# Patient Record
Sex: Female | Born: 1980 | Race: White | Hispanic: No | Marital: Single | Smoking: Current every day smoker
Health system: Southern US, Community
[De-identification: ages and names within clinical notes are randomized; demographics above are authoritative.]

---

## 2006-07-08 ENCOUNTER — Emergency Department (HOSPITAL_COMMUNITY): Admission: EM | Admit: 2006-07-08 | Discharge: 2006-07-08 | Payer: Self-pay | Admitting: Emergency Medicine

## 2008-05-09 ENCOUNTER — Ambulatory Visit: Payer: Self-pay | Admitting: Obstetrics and Gynecology

## 2008-05-09 ENCOUNTER — Inpatient Hospital Stay (HOSPITAL_COMMUNITY): Admission: AD | Admit: 2008-05-09 | Discharge: 2008-05-09 | Payer: Self-pay | Admitting: Obstetrics and Gynecology

## 2008-07-30 ENCOUNTER — Inpatient Hospital Stay (HOSPITAL_COMMUNITY): Admission: AD | Admit: 2008-07-30 | Discharge: 2008-07-30 | Payer: Self-pay | Admitting: Obstetrics and Gynecology

## 2008-07-31 ENCOUNTER — Inpatient Hospital Stay (HOSPITAL_COMMUNITY): Admission: AD | Admit: 2008-07-31 | Discharge: 2008-07-31 | Payer: Self-pay | Admitting: Obstetrics & Gynecology

## 2008-08-11 ENCOUNTER — Inpatient Hospital Stay (HOSPITAL_COMMUNITY): Admission: AD | Admit: 2008-08-11 | Discharge: 2008-08-12 | Payer: Self-pay | Admitting: Obstetrics and Gynecology

## 2008-08-17 ENCOUNTER — Inpatient Hospital Stay (HOSPITAL_COMMUNITY): Admission: AD | Admit: 2008-08-17 | Discharge: 2008-08-18 | Payer: Self-pay | Admitting: Obstetrics & Gynecology

## 2008-08-23 ENCOUNTER — Encounter (INDEPENDENT_AMBULATORY_CARE_PROVIDER_SITE_OTHER): Payer: Self-pay | Admitting: Obstetrics and Gynecology

## 2008-08-23 ENCOUNTER — Inpatient Hospital Stay (HOSPITAL_COMMUNITY): Admission: RE | Admit: 2008-08-23 | Discharge: 2008-08-25 | Payer: Self-pay | Admitting: Obstetrics and Gynecology

## 2008-09-19 LAB — CONVERTED CEMR LAB: Pap Smear: NORMAL

## 2009-03-04 IMAGING — US US OB COMP +14 WK
1 series · 14 of 28 positions shown · non-contrast
Comparison: none

OBSTETRICAL ULTRASOUND:
 This ultrasound exam was performed in the [HOSPITAL] Ultrasound Department.  The OB US report was generated in the AS system, and faxed to the ordering physician.  This report is also available in [REDACTED] PACS.

[Series 1: us ob comp +14 wk · 14 of 39 slices shown]
[im 2/39]
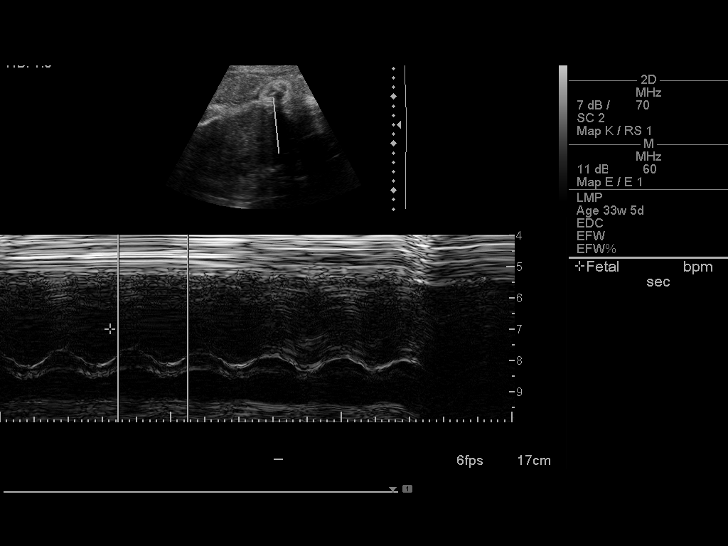
[im 5/39]
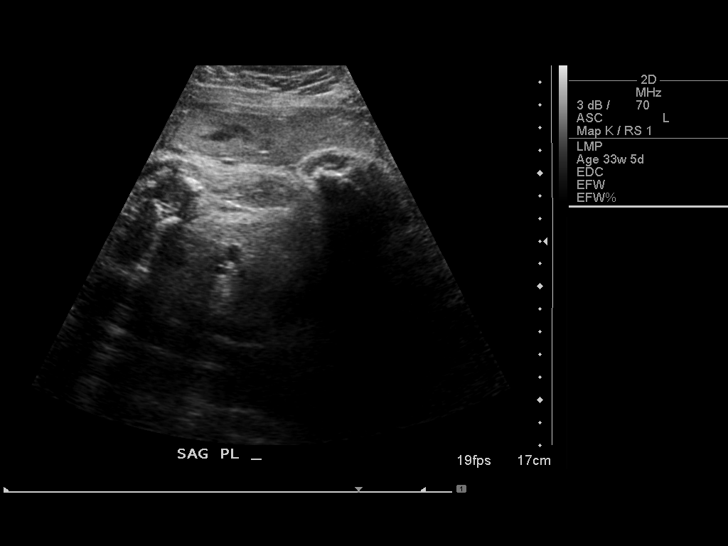
[im 8/39]
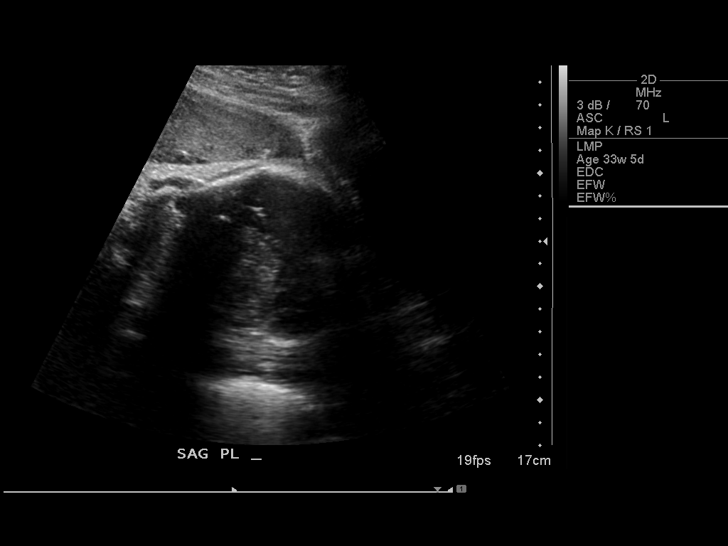
[im 10/39]
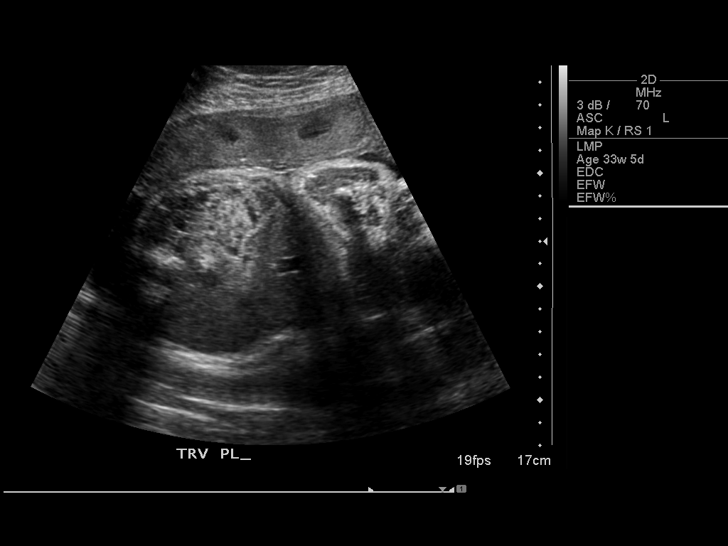
[im 13/39]
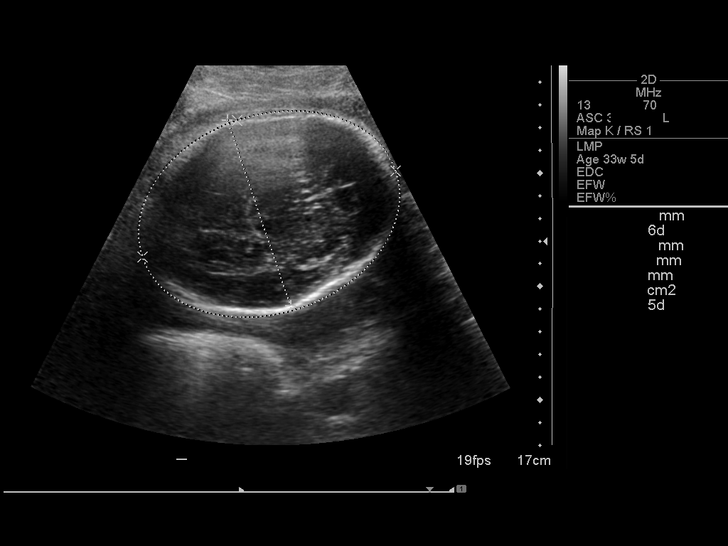
[im 16/39]
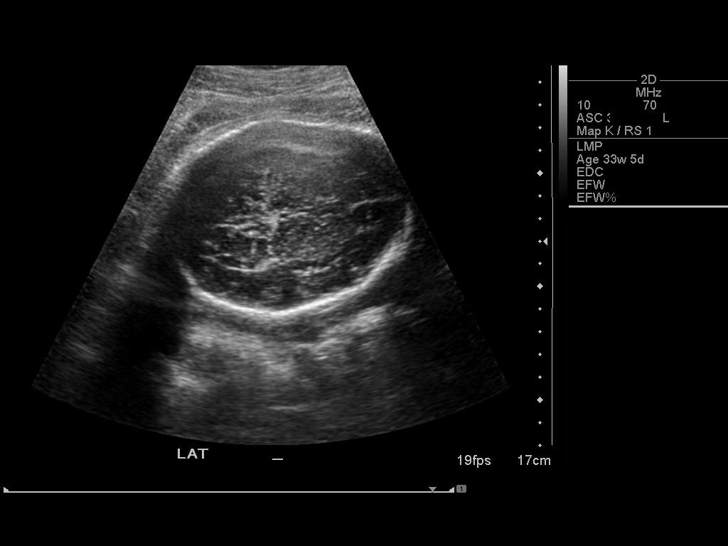
[im 19/39]
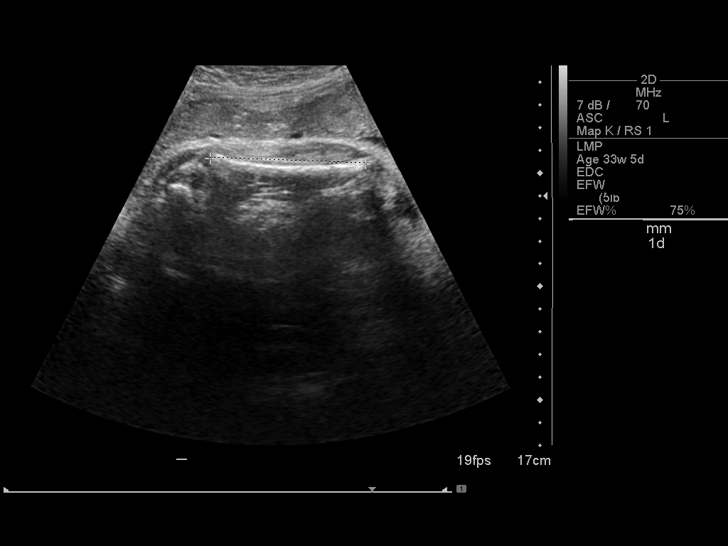
[im 22/39]
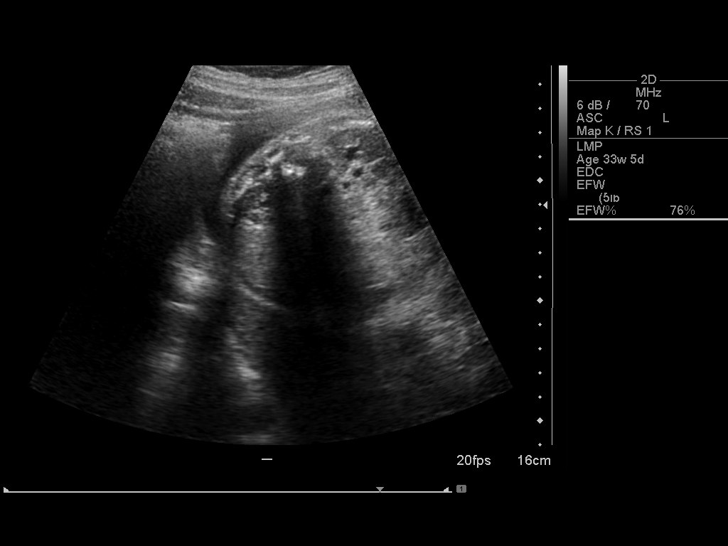
[im 24/39]
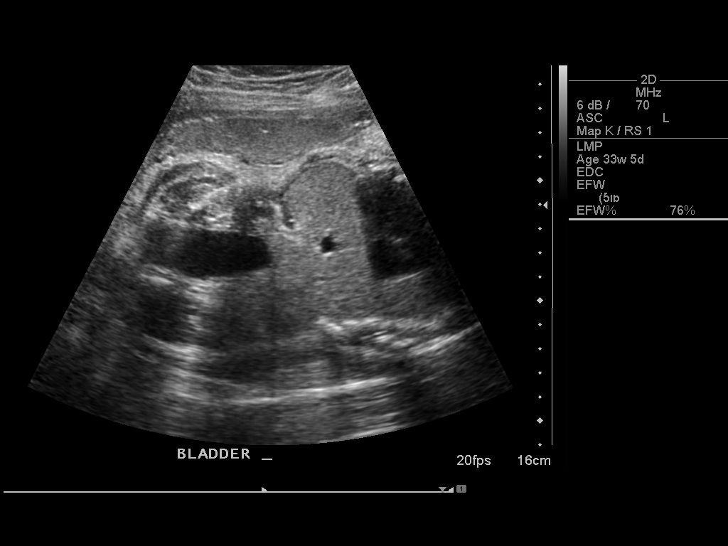
[im 27/39]
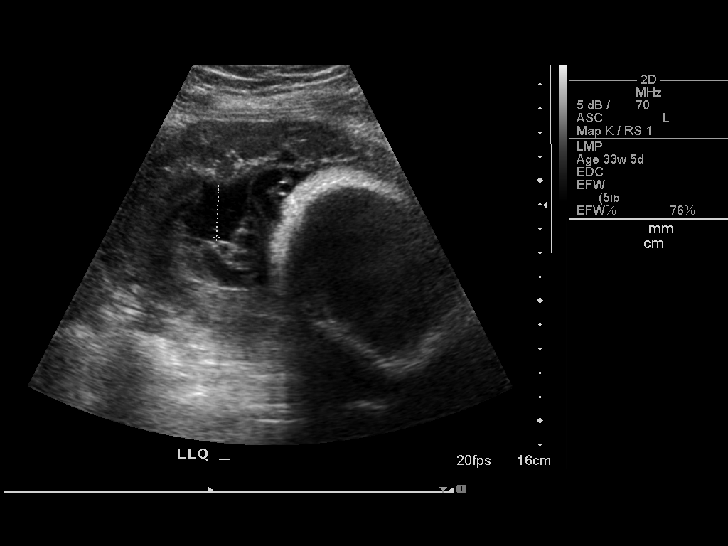
[im 30/39]
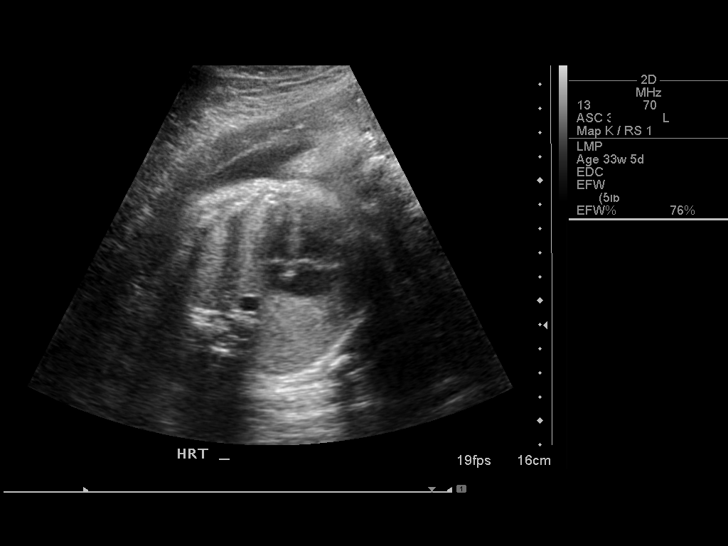
[im 33/39]
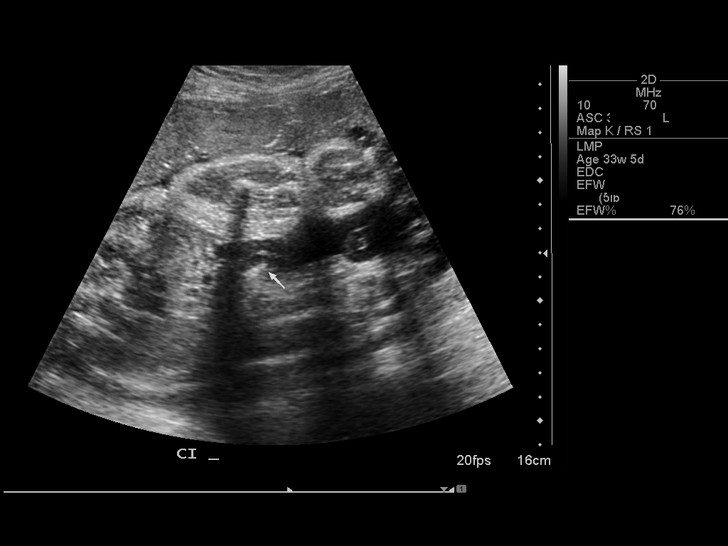
[im 36/39]
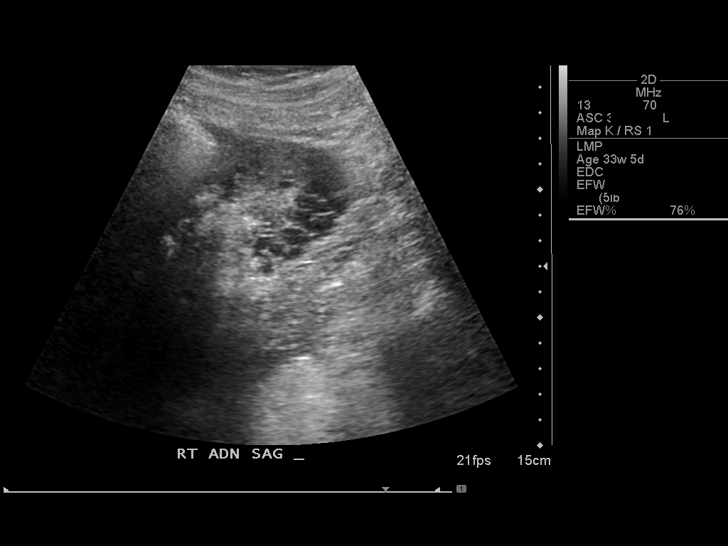
[im 39/39]
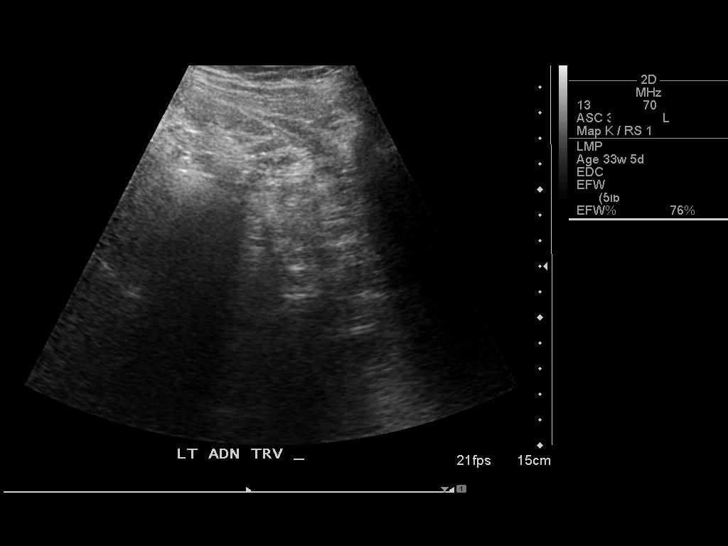

[14 of 28 positions shown; findings below may reference images not displayed]

IMPRESSION: See AS Obstetric US report.

## 2009-10-07 ENCOUNTER — Telehealth: Payer: Self-pay | Admitting: Family

## 2009-10-07 ENCOUNTER — Ambulatory Visit: Payer: Self-pay | Admitting: Family

## 2009-10-07 DIAGNOSIS — F411 Generalized anxiety disorder: Secondary | ICD-10-CM | POA: Insufficient documentation

## 2009-10-07 LAB — CONVERTED CEMR LAB
Free T4: 1.24 ng/dL (ref 0.80–1.80)
T3, Free: 3.1 pg/mL (ref 2.3–4.2)
TSH: 1.268 microintl units/mL (ref 0.350–4.500)

## 2010-02-07 ENCOUNTER — Telehealth (INDEPENDENT_AMBULATORY_CARE_PROVIDER_SITE_OTHER): Payer: Self-pay | Admitting: *Deleted

## 2010-09-26 NOTE — Progress Notes (Signed)
Summary: patient states she has no records anywhere else   Phone Note Call from Patient   Caller: patient live Call For: [place]  Summary of Call: patient states when she filled out her new patient paperwork that she has no records anywhere else.  Initial call taken by: Roselle Locus,  October 07, 2009 1:32 PM

## 2010-09-26 NOTE — Assessment & Plan Note (Signed)
Summary: NEW PT/BCBS/NS/KDC   Vital Signs:  Patient profile:   30 year old female Menstrual status:  regular LMP:     09/12/2009 Height:      64.50 inches Weight:      159 pounds BMI:     26.97 Temp:     98.4 degrees F oral Pulse rate:   72 / minute BP sitting:   124 / 70  (right arm)  Vitals Entered By: Doristine Devoid (October 07, 2009 1:47 PM) CC: NEW EST- anxiety and stress  LMP (date): 09/12/2009     Menstrual Status regular Enter LMP: 09/12/2009 Last PAP Result normal   CC:  NEW EST- anxiety and stress .  History of Present Illness: [name] is a 30 year old female who presents today to establish care.  Her chief complaint today is anxiety.  She is a single mother of two children ages 79 and 52 and is also employed at [employer]  which is currently undergoing changes- they are offering employees various options as their branch is closing.  Notes approximately 3 panic attacks a day which are accompanied by palpitations and shortness of breath.  She continues to feel nervous in between these panic attacks.  She denies associated depression.  She is sleeping poorly.  Wakes up frequently at night.  5 years ago she was treated for anxiety- notes that she was treated with zoloft and paxil at that time.  Felt drowsy on these meds.    Preventive Screening-Counseling & Management  Alcohol-Tobacco     Smoking Status: current  Allergies (verified): 1)  ! 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 Lungs are clear to auscultation, no crackles or wheezes. Heart:   Normal rate and regular rhythm. S1 and S2 normal without gallop, murmur, click, rub or other extra sounds. Psych:  normally interactive.   appears mildly anxious   Impression & Recommendations:  Problem # 1:  ANXIETY (ICD-300.00) Assessment Deteriorated Will try fluoxetine with as needed klonopin.  Plan reevaluation in 1 month.  Patient was counselled to d/c med and go to ER if she develops suicidal ideation. It sounds like patient is having a great deal of stress currently which is likely exacerbating her anxiety and panic attacks.  Will check TFT's to rule out hyper thyroid as contributing to patient's symptoms of anxiety.  Plan for full physical next visit.   Her updated medication list for this problem includes:    Fluoxetine Hcl 10 Mg Caps (Fluoxetine hcl) ..... One tablet by mouth daily x 1 week, then increase to 2 tabs by mouth daily    Klonopin 0.5 Mg Tabs (Clonazepam) ..... One tablet by mouth three times a day as needed  Orders: T-TSH ([id number]) T-T4, Free [phone]) T-T3, Free [phone])  Complete Medication List: 1)  Fluoxetine Hcl 10 Mg Caps (Fluoxetine hcl) .... One tablet by mouth daily x 1 week, then increase to 2 tabs by mouth daily 2)  Klonopin 0.5 Mg Tabs (Clonazepam) .... One tablet by mouth three times a day as needed  Patient Instructions: 1)  It will likely take several weeks before you will notice improvement on fluoxetine. 2)  Side effects of this  medicine may include drowsiness or nausea.  If this becomes an issue for you call for further instructions. 3)  Very rarely people may develop suicidal thoughts when taking these types of medicines- should this happen to you, discontinue medication and go directly to the emergency room. 4)  Please arrange a follow up appointment in 1 month or a complete physical and follow up. 5)  Please return fasting for the following labs: 6)  CBC, BMET, FLP (V70) 7)  It was a pleasure to meet  you! Prescriptions: KLONOPIN 0.5 MG TABS (CLONAZEPAM) one tablet by mouth three times a day as needed  #30 x 0   Entered and Authorized by:   Lemont Fillers FNP   Signed by:   Lemont Fillers FNP on 10/07/2009   Method used:   Print then Give to Patient   RxID:   [id number] FLUOXETINE HCL 10 MG CAPS (FLUOXETINE HCL) one tablet by mouth daily x 1 week, then increase to 2 tabs by mouth daily  #30 x 0   Entered and Authorized by:   Lemont Fillers FNP   Signed by:   Lemont Fillers FNP on 10/07/2009   Method used:   Electronically to        [facility] [address] (retail)       [phone]       [place], [place]  [address]       Ph: [phone]       Fax: [phone]   RxID:   [id number]   Preventive Care Screening  Last Tetanus Booster:    Date:  05/27/2009    Results:  Td   Pap Smear:    Date:  09/19/2008    Results:  normal

## 2010-09-26 NOTE — Progress Notes (Signed)
Summary: elevated bp  Phone Note Call from Patient   Caller: Patient Summary of Call: patient called left work bp elevated 138/90 says she is having some HA and vision issues unable to travel to see Efraim Kaufmann at [place] informed patient since this is new issue for her and w/ symptoms she will go to [facility] for evaluation ............Marland KitchenDoristine Devoid  February 07, 2010 12:40 PM

## 2010-09-26 NOTE — Letter (Signed)
   [facility] [address] [place], [place] [address] [phone]    October 07, 2009   [place] [address] [place], [place] [address]  RE:  LAB RESULTS  Dear  Ms. Conley Rolls,  The following is an interpretation of your most recent lab tests.  Please take note of any instructions provided or changes to medications that have resulted from your lab work.  THYROID STUDIES:  Thyroid studies normal TSH: 1.268      Sincerely Yours,    Lemont Fillers FNP

## 2010-11-13 ENCOUNTER — Other Ambulatory Visit (HOSPITAL_COMMUNITY)
Admission: RE | Admit: 2010-11-13 | Discharge: 2010-11-13 | Disposition: A | Discharge status: Home / Self Care | Payer: BC Managed Care – PPO | Source: Ambulatory Visit | Attending: Obstetrics & Gynecology | Admitting: Obstetrics & Gynecology

## 2010-11-13 ENCOUNTER — Encounter (INDEPENDENT_AMBULATORY_CARE_PROVIDER_SITE_OTHER): Payer: Self-pay | Admitting: Obstetrics & Gynecology

## 2010-11-13 ENCOUNTER — Other Ambulatory Visit: Payer: Self-pay | Admitting: Obstetrics & Gynecology

## 2010-11-13 DIAGNOSIS — R87612 Low grade squamous intraepithelial lesion on cytologic smear of cervix (LGSIL): Secondary | ICD-10-CM

## 2010-11-13 DIAGNOSIS — R87619 Unspecified abnormal cytological findings in specimens from cervix uteri: Secondary | ICD-10-CM | POA: Insufficient documentation

## 2010-11-13 DIAGNOSIS — R87611 Atypical squamous cells cannot exclude high grade squamous intraepithelial lesion on cytologic smear of cervix (ASC-H): Secondary | ICD-10-CM

## 2010-12-07 ENCOUNTER — Ambulatory Visit: Payer: BC Managed Care – PPO | Admitting: Obstetrics & Gynecology

## 2010-12-07 DIAGNOSIS — D069 Carcinoma in situ of cervix, unspecified: Secondary | ICD-10-CM

## 2010-12-20 ENCOUNTER — Other Ambulatory Visit: Payer: Self-pay | Admitting: Family Medicine

## 2010-12-20 ENCOUNTER — Encounter (INDEPENDENT_AMBULATORY_CARE_PROVIDER_SITE_OTHER): Payer: BC Managed Care – PPO | Admitting: Family Medicine

## 2010-12-20 ENCOUNTER — Other Ambulatory Visit (HOSPITAL_COMMUNITY)
Admission: RE | Admit: 2010-12-20 | Discharge: 2010-12-20 | Disposition: A | Discharge status: Home / Self Care | Payer: BC Managed Care – PPO | Source: Ambulatory Visit | Attending: Family Medicine | Admitting: Family Medicine

## 2010-12-20 DIAGNOSIS — R87613 High grade squamous intraepithelial lesion on cytologic smear of cervix (HGSIL): Secondary | ICD-10-CM

## 2010-12-20 DIAGNOSIS — N871 Moderate cervical dysplasia: Secondary | ICD-10-CM | POA: Insufficient documentation

## 2010-12-21 NOTE — Group Therapy Note (Signed)
NAMENGOC, DETJEN                  ACCOUNT NO.:  [id number]  MEDICAL RECORD NO.:  [id number]           PATIENT TYPE:  A  LOCATION:  [facility]                   FACILITY:  [facility]  PHYSICIAN:  Lucina Mellow, DO   DATE OF BIRTH:  1981-01-09  DATE OF SERVICE:  12/20/2010                                 CLINIC NOTE  The patient is a 30 year old female gravida 3, para 3 who was seen in the clinic after having an abnormal Pap smear, followed up in the colposcopy.  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  The patient was scheduled in for LEEP procedure, and the patient presents today for the procedure.  The patient denies any symptoms at this time. She states this was her first ever abnormal Pap test, and she has never had any procedures done to her cervix up until the colpo a few weeks ago.  Lab values today included negative urine pregnancy test.  The patient was consented and a time-out was performed prior to the procedure.  The patient was placed in the dorsal lithotomy position and a sterile speculum was placed.  A stain with acetic acid was used to identify the edges and margins of the lesion, and it was noted to go outside of the squamocolumnar junction around the 12 o'clock position and around about 6 o'clock to 3 o'clock as well with mosaic vessels seen.  The patient then received a paracervical block of 1% lidocaine approximately 18 mL of lidocaine.  The patient had good anesthetic results from that.  The medium sized V curette was then placed on the LEEP machine.  It was placed next at 50 and a circumferential excisional biopsy was done at that time with good control of bleeding and was passed off for pathology.  The rollerball was then used for curettage to assist with bleeding, minimal bleeding was noted.  The patient tolerated the procedure well.  The patient was given postprocedure instructions that she would experience muscle like cramping and  discomfort. Recommend not to douche, use tampons, or have sexual intercourse, not to use a hot tub bath, but she could shower, to report any bleeding heavier than her period.  Severe pain or temperature greater than 100.5 degrees and to report to the MAU and she was scheduled for a followup in 1-2 weeks to discuss her results and her symptomatology.  Because the patient was very concerned about any kind of odor after the procedure as she had been reading about the procedure prior to today.  We discussed medical options and she agreed to take Flagyl 500 twice a day for 7-10 days to help with that.  The patient also was given a prescription for Motrin 800 mg q.8 for pain, was recommended to take this around the clock for least the next 72 hours and Percocet 5/325 for any kind of breakthrough pain to assist with any discomfort.  The patient voiced understanding of all these teaching instructions and agrees with the plan.  All of her questions were answered.          ______________________________ Lucina Mellow, DO    SH/MEDQ  D:  12/20/2010  T:  12/21/2010  Job:  [id number]

## 2011-01-09 NOTE — H&P (Signed)
[name], WIX                  ACCOUNT NO.:  [id number]   MEDICAL RECORD NO.:  [id number]          PATIENT TYPE:  INP   LOCATION:  9158                          [facility]   PHYSICIAN:  Lenoard Aden, M.D.DATE OF BIRTH:  1981-06-29   DATE OF ADMISSION:  08/17/2008  DATE OF DISCHARGE:                              HISTORY & PHYSICAL   CHIEF COMPLAINT:  Worsening oligohydramnios, elevated blood pressure,  and headache.   HISTORY OF PRESENT ILLNESS:  She is a 30 year old female G3, P2 at 53  and 3/7 weeks with a history of admission for an AFI of 1 week ago,  which responded to IV fluid hydration.  Biophysical profile of 6/8 in  the office today and AFI of 3.  She has currently been placed on  labetalol, this pregnancy for elevated blood pressure and reports an  occasional mild headache.   ALLERGIES:  She has allergies to PENICILLIN.   PERSONAL HISTORY:  She has a personal history of previous pregnancy  smoking exposure.   MEDICATIONS:  Prenatal vitamins and labetalol.   FAMILY HISTORY:  She has a family history of heart disease and stroke.  She has a history of vaginal delivery 5 pounds 3 ounces at 37 weeks',  uncomplicated, and a history of C-section for fetal distress in 2002.   PRENATAL COURSE:  It has been complicated by elevated blood pressure,  history of intermittent headaches, history of oligohydramnios.  The  patient has previously discussed having a repeat C-section, but now  desires trial of labor if possible.   PHYSICAL EXAMINATION:  GENERAL:  She is a well-developed, well-nourished  female in no acute distress.  HEENT:  Normal.  LUNGS:  Clear.  HEART:  Regular rate and rhythm.  ABDOMEN:  Soft, gravid, and nontender.  Estimated fetal weight 5.5 to 6  pounds.  Cervix is 2-3 cm, 60% vertex -1.  EXTREMITIES:  There are no cords.  NEUROLOGIC:  DTRs of 2+ with one beat of clonus on the left.  SKIN:  Intact.   NST is reactive. Rare contractions were  noted.  PIH labs and 24-hour  urine from today are pending.  NST currently is reactive as noted.   IMPRESSION:  1. This is a 36 and 3/7-week intrauterine pregnancy.  2. Previous cesarean section with a history of previous successful      vaginal delivery.  3. Gestational hypertension versus chronic hypertension with normal      labs today and now new onset worsening oligohydramnios.   PLAN:  We will repeat BPP tonight.  Check labs and we will discuss  delivery, pending labs, and repeat BPP rate of delivery to be further  discussed.      Lenoard Aden, M.D.  Electronically Signed     RJT/MEDQ  D:  08/17/2008  T:  08/18/2008  Job:  [id number]

## 2011-01-09 NOTE — H&P (Signed)
[name], [name]                  ACCOUNT NO.:  [id number]   MEDICAL RECORD NO.:  [id number]          PATIENT TYPE:  MAT   LOCATION:  [facility]                          [facility]   PHYSICIAN:  Lenoard Aden, M.D.DATE OF BIRTH:  [birth year]   DATE OF ADMISSION:  07/30/2008  DATE OF DISCHARGE:  07/30/2008                              HISTORY & PHYSICAL   CHIEF COMPLAINT:  Decreased fetal movement, but nonreactive NST in the  office.   She is a 30 year old white female G3, P2, history of previous C-section,  who is scheduled for repeat C-section, who presents with decreased fetal  movement, which has been a chronic issue this pregnancy.  She also had  elevated blood pressure in the office.  She reports occasional floaters.  Denies headaches or epigastric pain.   She has allergies to PENICILLIN.   MEDICATIONS:  Prenatal vitamins.   She is a reformed smoker.  Denies domestic or physical violence.   FAMILY HISTORY:  Heart disease and stroke.   PREVIOUS OB HISTORY:  Remarkable for vaginal delivery and C-section as  previously noted.   PRENATAL COURSE:  Otherwise uncomplicated.   PHYSICAL EXAMINATION:  GENERAL:  She is a well-developed, well-nourished  female in no acute distress.  HEENT:  Normal.  LUNGS:  Clear.  HEART:  Regular rhythm.  ABDOMEN:  Soft, gravid, nontender.  No upper quadrant tenderness.  No  CVA tenderness.  EXTREMITIES:  There are no cords.  DTRs are 2+.  NEUROLOGIC:  Nonfocal.  SKIN:  Intact.  PELVIC:  Deferred.  VITAL SIGNS:  Blood pressure is ranging initially 149/91, repeat blood  pressure is 130s/70s.   PIH labs are within normal limits with normal platelet count, normal  liver function tests, normal uric acid.  Her initial NST is nonreactive  with subsequent monitoring with greater than 10 x 10 beat accelerations,  no decelerations are noted and variability is fair to good.  Her BPP  reveals an appropriate for gestational age fetus with a normal  AFI.  The  BPP is 6/8 with breathing noted, but nonsustained at this time.   IMPRESSION:  1. 34-week intrauterine pregnancy.  2. Elevated blood pressure.  No stigmata of preeclampsia with normal      labs and normal repeat blood pressures, 24-hour urine pending.  3. Decreased fetal movement with reassuring tracing and biophysical      profile, however, completed biophysical profile by strict  criteria      6/10.   PLAN:  Repeat BPP in 24 hours.  Preeclamptic precautions were given.  Discharge home.  We will follow up as noted.  24-hour urine to be  collected.      Lenoard Aden, M.D.  Electronically Signed     RJT/MEDQ  D:  07/30/2008  T:  07/31/2008  Job:  [id number]

## 2011-01-09 NOTE — H&P (Signed)
[name], MANZER NO.:  [id number]   MEDICAL RECORD NO.:  [id number]           PATIENT TYPE:   LOCATION:                                 FACILITY:   PHYSICIAN:  Lenoard Aden, M.D.     DATE OF BIRTH:   DATE OF ADMISSION:  DATE OF DISCHARGE:                              HISTORY & PHYSICAL   CHIEF COMPLAINT:  Oligohydramnios.   She is a 30 year old female G3, P2 at 35-4/7 weeks' gestation with  oligohydramnios with an AFI of 4.8 today.  She has a history of  pregnancy induced hypertension __________her urine currently __________  occasional headache.  She is appropriate for gestational age fetus noted  by ultrasound and normal AFI 2 days ago, now with oligo today, 8/8 in  the office today.   Her medications include labetalol and prenatal vitamins.   She has LATEX allergy.  She is allergic to PENICILLIN __________   She has a history of 2 previous pregnancies, one vaginal delivery in  1999, and a C-section for fetal distress in 2002.   PHYSICAL EXAMINATION:  GENERAL:  She is a well-developed, well-nourished  female in no acute distress.  VITAL SIGNS:  Blood pressure 138/94.  HEENT:  Normal.  LUNGS: Clear.  ABDOMEN:  Soft, gravid, and nontender.  Cervix is closed, 60% vertex, -  1.__________  NEUROLOGIC:  Nonfocal.   IMPRESSION:  1. A 35 weeks OB.  2. __________.  3. History of gestational hypertension versus chronic hypertension,      __________ IV fluids, monitoring blood pressure closely.  Check      labs, PT, PTT, and AFI 24 hours prior to delivery.  __________      Lenoard Aden, M.D.  Electronically Signed     RJT/MEDQ  D:  08/11/2008  T:  08/12/2008  Job:  [id number]

## 2011-01-09 NOTE — H&P (Signed)
[name], [name]                  ACCOUNT NO.:  [id number]   MEDICAL RECORD NO.:  [id number]          PATIENT TYPE:  INP   LOCATION:  9101                          [facility]   PHYSICIAN:  Lenoard Aden, M.D.DATE OF BIRTH:  [birth year]   DATE OF ADMISSION:  08/23/2008  DATE OF DISCHARGE:                              HISTORY & PHYSICAL   CHIEF COMPLAINT:  Chronic hypertension with refractory oligo for  induction.   She is a 30 year old female G3, P2-0-0-2 at 50 weeks' gestation with  persistent refractory oligo with an AFI of less than 5 most recently,  3.8 and 4.5 for attempts at vaginal delivery.  She has a history of  previous successful vaginal delivery followed by C-section for fetal  distress.  Risks and benefits of C-section versus evac previously  discussed.  Previous low segment transverse cesarean section documented  she has allergies to penicillin.   She has medications include labetalol and prenatal vitamins.   She has a family history of stroke and heart disease.  Prenatal course  was complicated by refractory oligohydramnios, hypertension emerging in  the late mid to late third trimester for which she was placed on  labetalol.  She has been hospitalized on 2 occasions for persistent  oligo.  Discussion of this case with phone consult with Maternal Fetal  Medicine, decision was made to proceed with delivery at 37 weeks.   PHYSICAL EXAMINATION:  GENERAL:  She is a well-developed, well-nourished  white female in no acute distress.  VITAL SIGNS:  Blood pressure ranging 140/90s-100.  HEENT:  Normal.  Neck:  Supple.  Full range of motion.  Lungs:  Clear.  Heart:  Regular rhythm.  ABDOMEN:  Soft, gravid, nontender.  Estimated fetal weight 6.5 pounds.  Cervix is 3 cm, 70% vertex, -1.  EXTREMITIES:  No cords.  DTRs 2+.  No evidence of clonus.  NEUROLOGIC:  Nonfocal.  SKIN:  Intact.   IMPRESSION:  1. A 37-week obstetrics.  2. Previous cesarean section with  history of successful vaginal      delivery.  3. Questionable chronic hypertension versus gestational hypertension,      stable on labetalol.  Normal 24-hour urine, normal labs.  4. Refractory oligohydramnios.   PLAN:  Proceed with attempts at vaginal delivery.  Pitocin was started.  IUPC to be placed.  Epidural as needed.  Cautious attempts at vaginal  delivery discussed.      Lenoard Aden, M.D.  Electronically Signed     RJT/MEDQ  D:  08/23/2008  T:  08/23/2008  Job:  [id number]

## 2011-01-11 ENCOUNTER — Ambulatory Visit: Payer: BC Managed Care – PPO | Admitting: Family Medicine

## 2011-01-11 DIAGNOSIS — R87613 High grade squamous intraepithelial lesion on cytologic smear of cervix (HGSIL): Secondary | ICD-10-CM

## 2011-01-12 NOTE — Group Therapy Note (Signed)
[name], [name]                  ACCOUNT NO.:  [id number]  MEDICAL RECORD NO.:  [id number]           PATIENT TYPE:  A  [facility]ATION:  [facility]                   [facility]  PHYSICIAN:  Lucina Mellow, DO   DATE OF BIRTH:  [birth year]  DATE OF SERVICE:                                 CLINIC NOTE  The patient is a 30 year old gravida 3, para 3 who had a Pap test having HGSIL and then a followup of the colposcopy that also showed HGSIL with negative ECC.  The patient presented for her LEEP procedure which was performed by myself on December 20, 2010.  The patient states that she did complete a 7-day course of Flagyl and she noticed a small amount of discharge yesterday, no discharge currently, but she does on occasion have some discharge.  The patient states that she has already had intercourse.  She had intercourse yesterday of the day before and has small enough spotting afterwords.  The intercourse was not painful and she states that she otherwise feels fine.  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  The patient is recommended to have q.6 months Pap test, for which she understands and agrees.  All of her questions are answered.  It additional prescription for Flagyl 500 mg twice a day is provided to her and she is to call the clinic if she has any further questions or concerns.  Otherwise, she will follow up routinely in 6 months for a repeat Pap.          ______________________________ Lucina Mellow, DO    SH/MEDQ  D:  01/11/2011  T:  01/12/2011  Job:  [id number]

## 2011-05-30 LAB — URINE MICROSCOPIC-ADD ON

## 2011-05-30 LAB — URINALYSIS, ROUTINE W REFLEX MICROSCOPIC
Bilirubin Urine: NEGATIVE
Glucose, UA: NEGATIVE
Ketones, ur: >80 — AB
Nitrite: NEGATIVE
Urobilinogen, UA: 0.2
pH: 6.5

## 2011-06-01 LAB — CBC
HCT: 36.2 % (ref 36.0–46.0)
HCT: 37.2 % (ref 36.0–46.0)
Hemoglobin: 12.6 g/dL (ref 12.0–15.0)
Hemoglobin: 13.7 g/dL (ref 12.0–15.0)
MCHC: 33.6 g/dL (ref 30.0–36.0)
MCHC: 33.7 g/dL (ref 30.0–36.0)
MCHC: 34.3 g/dL (ref 30.0–36.0)
MCV: 91.6 fL (ref 78.0–100.0)
MCV: 92.4 fL (ref 78.0–100.0)
MCV: 92.6 fL (ref 78.0–100.0)
Platelets: 229 10*3/uL (ref 150–400)
Platelets: 252 10*3/uL (ref 150–400)
Platelets: 272 10*3/uL (ref 150–400)
RBC: 4.03 MIL/uL (ref 3.87–5.11)
RBC: 4.38 MIL/uL (ref 3.87–5.11)
RDW: 13.5 % (ref 11.5–15.5)
RDW: 13.5 % (ref 11.5–15.5)
RDW: 13.7 % (ref 11.5–15.5)
WBC: 13.3 10*3/uL — ABNORMAL HIGH (ref 4.0–10.5)

## 2011-06-01 LAB — COMPREHENSIVE METABOLIC PANEL
ALT: 21 U/L (ref 0–35)
ALT: 22 U/L (ref 0–35)
AST: 27 U/L (ref 0–37)
AST: 29 U/L (ref 0–37)
Albumin: 2.9 g/dL — ABNORMAL LOW (ref 3.5–5.2)
Albumin: 3 g/dL — ABNORMAL LOW (ref 3.5–5.2)
BUN: 5 mg/dL — ABNORMAL LOW (ref 6–23)
CO2: 20 mEq/L (ref 19–32)
Calcium: 9 mg/dL (ref 8.4–10.5)
Calcium: 9.5 mg/dL (ref 8.4–10.5)
Chloride: 103 mEq/L (ref 96–112)
Chloride: 108 mEq/L (ref 96–112)
Chloride: 96 mEq/L (ref 96–112)
Creatinine, Ser: 0.41 mg/dL (ref 0.4–1.2)
Creatinine, Ser: 0.57 mg/dL (ref 0.4–1.2)
Creatinine, Ser: 0.58 mg/dL (ref 0.4–1.2)
GFR calc Af Amer: >60 mL/min (ref >60)
GFR calc Af Amer: >60 mL/min (ref >60)
GFR calc Af Amer: >60 mL/min (ref >60)
GFR calc non Af Amer: >60 mL/min (ref >60)
Glucose, Bld: 81 mg/dL (ref 70–99)
Sodium: 124 mEq/L — ABNORMAL LOW (ref 135–145)
Sodium: 135 mEq/L (ref 135–145)
Total Bilirubin: 0.3 mg/dL (ref 0.3–1.2)
Total Bilirubin: 0.3 mg/dL (ref 0.3–1.2)
Total Bilirubin: 0.4 mg/dL (ref 0.3–1.2)
Total Protein: 6.2 g/dL (ref 6.0–8.3)
Total Protein: 6.3 g/dL (ref 6.0–8.3)

## 2011-06-01 LAB — LACTATE DEHYDROGENASE: LDH: 131 U/L (ref 94–250)

## 2011-06-01 LAB — URIC ACID: Uric Acid, Serum: 4.9 mg/dL (ref 2.4–7.0)

## 2011-07-05 ENCOUNTER — Encounter: Payer: BC Managed Care – PPO | Admitting: Family Medicine

## 2011-07-06 ENCOUNTER — Encounter: Payer: Self-pay | Admitting: *Deleted

## 2011-07-06 DIAGNOSIS — D069 Carcinoma in situ of cervix, unspecified: Secondary | ICD-10-CM

## 2011-07-06 DIAGNOSIS — R87612 Low grade squamous intraepithelial lesion on cytologic smear of cervix (LGSIL): Secondary | ICD-10-CM

## 2011-07-06 DIAGNOSIS — IMO0002 Reserved for concepts with insufficient information to code with codable children: Secondary | ICD-10-CM | POA: Insufficient documentation

## 2011-08-23 ENCOUNTER — Ambulatory Visit (INDEPENDENT_AMBULATORY_CARE_PROVIDER_SITE_OTHER): Payer: BC Managed Care – PPO | Admitting: Family Medicine

## 2011-08-23 ENCOUNTER — Encounter: Payer: Self-pay | Admitting: Family Medicine

## 2011-08-23 VITALS — BP 137/92 | HR 87 | Temp 98.5°F | Ht 66.0 in | Wt 155.5 lb

## 2011-08-23 DIAGNOSIS — D069 Carcinoma in situ of cervix, unspecified: Secondary | ICD-10-CM

## 2011-08-23 NOTE — Progress Notes (Signed)
  Subjective:    Patient ID: [name], female    DOB: 02/26/1981, 30 y.o.   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  [facility], which was abnormal per the patient, which was not sent with the referral.  I discussed with the patient options of having another PAP done versus rescheduling the appointment while getting results.  Patient opted to have PAP done today.   Review of Systems     Objective:   Physical Exam  Constitutional: She appears well-developed and well-nourished.  Genitourinary: Vagina normal.       Normal external genitalia.  Normal vaginal tissue.  No gross abnormality to cervix.  No CMT or adnexal masses palpated.      Assessment & Plan:  1.  Abnormal PAP smear. PAP done.  Will call patient with any abnormal results.

## 2011-09-06 ENCOUNTER — Telehealth: Payer: Self-pay | Admitting: *Deleted

## 2011-09-06 NOTE — Telephone Encounter (Signed)
Called pt and pt wanted to know her pap results from 08/23/11 visit.  I informed her the results.  Pt stated understanding and had no further questions.

## 2011-09-06 NOTE — Telephone Encounter (Signed)
Pt called wanting test results and would like call back.

## 2011-09-26 ENCOUNTER — Telehealth: Payer: Self-pay | Admitting: *Deleted

## 2011-09-26 DIAGNOSIS — B9689 Other specified bacterial agents as the cause of diseases classified elsewhere: Secondary | ICD-10-CM

## 2011-09-26 NOTE — Telephone Encounter (Signed)
Pt called stating that she is having BV symptoms. Thin discharge with odor. She has had it before. She would like to have meds called in to her pharmacy. She cannot afford to come into clinic now.

## 2011-09-27 MED ORDER — TINIDAZOLE 500 MG PO TABS: 2.0000 g | ORAL_TABLET | Freq: Every day | ORAL | Status: AC

## 2011-09-27 NOTE — Telephone Encounter (Signed)
Spoke with patient advised her that we can call in medicine for her. If symptoms return in next six months she should call and make appointment

## 2012-01-29 ENCOUNTER — Telehealth: Payer: Self-pay | Admitting: *Deleted

## 2012-01-29 NOTE — Telephone Encounter (Signed)
Pt called and requested a nurse call her back. Patient states that she has symptoms of BV and would like a medicine called in. I advised her that the last time we did this in January the doctor said she would need to come in for a visit if it returned within 6 month. Appt made for patient for tomorrow.

## 2012-01-30 ENCOUNTER — Encounter: Payer: Self-pay | Admitting: Family Medicine

## 2012-01-30 ENCOUNTER — Ambulatory Visit (INDEPENDENT_AMBULATORY_CARE_PROVIDER_SITE_OTHER): Payer: BC Managed Care – PPO | Admitting: Family Medicine

## 2012-01-30 VITALS — BP 142/91 | HR 82 | Ht 64.0 in | Wt 167.1 lb

## 2012-01-30 DIAGNOSIS — N898 Other specified noninflammatory disorders of vagina: Secondary | ICD-10-CM

## 2012-01-30 DIAGNOSIS — L709 Acne, unspecified: Secondary | ICD-10-CM

## 2012-01-30 DIAGNOSIS — R87612 Low grade squamous intraepithelial lesion on cytologic smear of cervix (LGSIL): Secondary | ICD-10-CM

## 2012-01-30 DIAGNOSIS — R300 Dysuria: Secondary | ICD-10-CM

## 2012-01-30 DIAGNOSIS — L708 Other acne: Secondary | ICD-10-CM

## 2012-01-30 DIAGNOSIS — IMO0002 Reserved for concepts with insufficient information to code with codable children: Secondary | ICD-10-CM

## 2012-01-30 LAB — POCT URINALYSIS DIP (DEVICE)
Protein, ur: NEGATIVE mg/dL
Specific Gravity, Urine: 1.02 (ref 1.005–1.030)
Urobilinogen, UA: 0.2 mg/dL (ref 0.0–1.0)

## 2012-01-30 NOTE — Progress Notes (Signed)
  Subjective:    Patient ID: [name], female    DOB: 01-08-1981, 31 y.o.   MRN: [id number]  HPI  Vaginal discharge.  Says it started 2 days ago.  She complains of vaginal dryness, then noticed grey discharge.  Denies any foul odor.  She is currently on her period.  Denies any abdominal/pelvic or back pain.    Dysuria: She also complains of burning with urination that started 2 days ago.  She did have increased urinary frequency, but this is resolving.  No hematuria.  No fevers, chills, nausea or vomiting.    Acne: patient concerned about acne.  She has not tried any prescription medications yet.  Would like to establish care with a PCP.   Review of Systems  Per HPI    Objective:   Physical Exam  Constitutional: No distress.  Abdominal: Soft. Bowel sounds are normal. She exhibits no distension. There is no tenderness. There is no rebound and no guarding.  Genitourinary: Vagina normal. There is no rash, tenderness or lesion on the right labia. There is no rash, tenderness or lesion on the left labia. Cervix exhibits no motion tenderness, no discharge and no friability. Right adnexum displays no mass, no tenderness and no fullness. Left adnexum displays no mass, no tenderness and no fullness. No erythema around the vagina. No vaginal discharge found.          Assessment & Plan:

## 2012-01-30 NOTE — Assessment & Plan Note (Signed)
Because patient has had LEEP, will also do a pap smear today.  Last pap was December 2012.

## 2012-01-30 NOTE — Assessment & Plan Note (Addendum)
Will order UA today. Advised patient to call MD if symptoms worsen - develops fever, chills, N/V, abdominal/pelvic pain.

## 2012-01-30 NOTE — Assessment & Plan Note (Signed)
No copious amount of discharge seen on exam, however foul odor present. Wet prep, GC/chlamydia performed today. Will call patient if results are abnormal.

## 2012-01-30 NOTE — Progress Notes (Signed)
Patient seen and examined.  Agree with resident note.

## 2012-01-31 LAB — WET PREP, GENITAL: Yeast Wet Prep HPF POC: NONE SEEN

## 2012-02-20 ENCOUNTER — Ambulatory Visit: Payer: BC Managed Care – PPO | Admitting: Advanced Practice Midwife

## 2012-12-17 ENCOUNTER — Encounter (HOSPITAL_BASED_OUTPATIENT_CLINIC_OR_DEPARTMENT_OTHER): Payer: Self-pay | Admitting: *Deleted

## 2012-12-17 ENCOUNTER — Emergency Department (HOSPITAL_BASED_OUTPATIENT_CLINIC_OR_DEPARTMENT_OTHER)
Admission: EM | Admit: 2012-12-17 | Discharge: 2012-12-17 | Disposition: A | Discharge status: Home / Self Care | Payer: BC Managed Care – PPO | Attending: Emergency Medicine | Admitting: Emergency Medicine

## 2012-12-17 DIAGNOSIS — F17200 Nicotine dependence, unspecified, uncomplicated: Secondary | ICD-10-CM | POA: Insufficient documentation

## 2012-12-17 DIAGNOSIS — H6120 Impacted cerumen, unspecified ear: Secondary | ICD-10-CM | POA: Insufficient documentation

## 2012-12-17 DIAGNOSIS — H60399 Other infective otitis externa, unspecified ear: Secondary | ICD-10-CM | POA: Insufficient documentation

## 2012-12-17 DIAGNOSIS — H6093 Unspecified otitis externa, bilateral: Secondary | ICD-10-CM

## 2012-12-17 DIAGNOSIS — Z880 Allergy status to penicillin: Secondary | ICD-10-CM | POA: Insufficient documentation

## 2012-12-17 DIAGNOSIS — H6123 Impacted cerumen, bilateral: Secondary | ICD-10-CM

## 2012-12-17 DIAGNOSIS — J3489 Other specified disorders of nose and nasal sinuses: Secondary | ICD-10-CM | POA: Insufficient documentation

## 2012-12-17 MED ORDER — DOCUSATE SODIUM 100 MG PO CAPS
100.0000 mg | ORAL_CAPSULE | Freq: Once | ORAL | Status: AC
  Administered 2012-12-17: 100 mg via ORAL

## 2012-12-17 MED ORDER — DOCUSATE SODIUM 50 MG/5ML PO LIQD
ORAL | Status: AC
  Filled 2012-12-17: qty 10

## 2012-12-17 MED ORDER — NEOMYCIN-POLYMYXIN-HC 3.5-10000-1 OT SUSP: 4.0000 [drp] | Freq: Four times a day (QID) | OTIC | Status: AC

## 2012-12-17 MED ORDER — CIPROFLOXACIN-DEXAMETHASONE 0.3-0.1 % OT SUSP: OTIC | Status: DC

## 2012-12-17 NOTE — ED Notes (Signed)
Pt c/o left ear pain x 2 days  

## 2012-12-17 NOTE — ED Provider Notes (Signed)
History     CSN: [id number]  Arrival date & time 12/17/12  1242   First MD Initiated Contact with Patient 12/17/12 1417      Chief Complaint  Patient presents with  . Otalgia    (Consider location/radiation/quality/duration/timing/severity/associated sxs/prior treatment) HPI Patient presents to the emergency department chief complaint of left otalgia.  Patient states it has been worsening over the past 2 days.  Patient states that she also has associated sinus pressure, congestion, nasal discharge, itchy watery eyes and a history of seasonal allergies.  Patient denies any discharge from the ear, she denies any severe headaches, she denies any history of otitis media. Denies fevers, chills, myalgias, arthralgias. Denies DOE, SOB, chest tightness or pressure, radiation to left arm, jaw or back, or diaphoresis. Denies dysuria, flank pain, suprapubic pain, frequency, urgency, or hematuria. Denies headaches, light headedness, weakness, visual disturbances. Denies abdominal pain, nausea, vomiting, diarrhea or constipation.   History reviewed. No pertinent past medical history.  History reviewed. No pertinent past surgical history.  History reviewed. No pertinent family history.  History  Substance Use Topics  . Smoking status: Current Every Day Smoker -- 0.50 packs/day for 10 years    Types: Cigarettes  . Smokeless tobacco: Never Used  . Alcohol Use: Yes     Comment: social drinking    OB History   Grav Para Term Preterm Abortions TAB SAB Ect Mult Living   3 3 3       3       Review of Systems Ten systems reviewed and are negative for acute change, except as noted in the HPI.   Allergies  Penicillins  Home Medications   Current Outpatient Rx  Name  Route  Sig  Dispense  Refill  . phentermine 37.5 MG capsule   Oral   Take 37.5 mg by mouth every morning.             BP 134/93  Pulse 89  Temp(Src) 99.4 F (37.4 C) (Oral)  Resp 16  Ht 5\' 4"  (1.626 m)  Wt 170 lb  (77.111 kg)  BMI 29.17 kg/m2  SpO2 99%  LMP 12/10/2012  Physical Exam Physical Exam  Nursing note and vitals reviewed. Constitutional: She is oriented to person, place, and time. She appears well-developed and well-nourished. No distress.  HENT:  Head: Normocephalic and atraumatic.  Eyes: Conjunctivae normal and EOM are normal. Pupils are equal, round, and reactive to light. No scleral icterus.  Mouth: No exudates, erythema or swelling of the pharynx.  Uvula is midline airway is patent. Neck: Normal range of motion.  lateral tender cervical lymphadenopathy/ Ears: Bilateral cerumen impaction.  No tragal or mastoid tenderness.  No discharge or signs of otitis externa Cardiovascular: Normal rate, regular rhythm and normal heart sounds.  Exam reveals no gallop and no friction rub.   No murmur heard. Pulmonary/Chest: Effort normal and breath sounds normal. No respiratory distress.  Abdominal: Soft. Bowel sounds are normal. She exhibits no distension and no mass. There is no tenderness. There is no guarding.  Neurological: She is alert and oriented to person, place, and time.  Skin: Skin is warm and dry. She is not diaphoretic.    ED Course  Procedures (including critical care time)  Labs Reviewed - No data to display No results found.   1. Cerumen impaction, bilateral   2. Otitis externa of both ears       MDM   Filed Vitals:   12/17/12 1257  BP: 134/93  Pulse:  89  Temp: 99.4 F (37.4 C)  Resp: 16    Patient was cerumen impaction bilaterally.  She is currently having cerumen disimpaction.  We'll reevaluate shortly  Patient's this disimpaction went well and all cerumen has been removed.  She does appear to have bilateral otitis external.  No otitis media present.  We'll discharge the patient with Ciprodex.  She may followup with her PCP or ENT if her symptoms continue.      Arthor Captain, PA-C 12/17/12 1633

## 2012-12-18 NOTE — ED Provider Notes (Signed)
Medical screening examination/treatment/procedure(s) were performed by non-physician practitioner and as supervising physician I was immediately available for consultation/collaboration.   Carleene Cooper III, MD 12/18/12 1018

## 2014-06-28 ENCOUNTER — Encounter (HOSPITAL_BASED_OUTPATIENT_CLINIC_OR_DEPARTMENT_OTHER): Payer: Self-pay | Admitting: *Deleted

## 2014-10-04 ENCOUNTER — Other Ambulatory Visit (HOSPITAL_COMMUNITY): Payer: Self-pay | Admitting: Family Medicine

## 2014-10-04 DIAGNOSIS — R002 Palpitations: Secondary | ICD-10-CM

## 2014-10-08 ENCOUNTER — Ambulatory Visit (HOSPITAL_COMMUNITY)
Admission: RE | Admit: 2014-10-08 | Discharge: 2014-10-08 | Disposition: A | Discharge status: Home / Self Care | Payer: Medicaid Other | Source: Ambulatory Visit | Attending: Cardiovascular Disease | Admitting: Cardiovascular Disease

## 2014-10-08 DIAGNOSIS — Z8249 Family history of ischemic heart disease and other diseases of the circulatory system: Secondary | ICD-10-CM | POA: Insufficient documentation

## 2014-10-08 DIAGNOSIS — Z720 Tobacco use: Secondary | ICD-10-CM | POA: Diagnosis not present

## 2014-10-08 DIAGNOSIS — R42 Dizziness and giddiness: Secondary | ICD-10-CM | POA: Diagnosis not present

## 2014-10-08 DIAGNOSIS — R002 Palpitations: Secondary | ICD-10-CM | POA: Insufficient documentation

## 2014-10-08 DIAGNOSIS — R000 Tachycardia, unspecified: Secondary | ICD-10-CM

## 2014-10-08 DIAGNOSIS — I10 Essential (primary) hypertension: Secondary | ICD-10-CM | POA: Diagnosis not present

## 2014-10-08 NOTE — Progress Notes (Signed)
2D Echocardiogram Complete.  10/08/2014   Arista Kettlewell, RDCS
